# Patient Record
Sex: Female | Born: 1965 | Race: White | Hispanic: No | Marital: Single | Smoking: Never smoker
Health system: Southern US, Community
[De-identification: ages and names within clinical notes are randomized; demographics above are authoritative.]

## PROBLEM LIST (undated history)

## (undated) DIAGNOSIS — I10 Essential (primary) hypertension: Secondary | ICD-10-CM

## (undated) DIAGNOSIS — F32A Depression, unspecified: Secondary | ICD-10-CM

## (undated) DIAGNOSIS — G4730 Sleep apnea, unspecified: Secondary | ICD-10-CM

## (undated) DIAGNOSIS — K219 Gastro-esophageal reflux disease without esophagitis: Secondary | ICD-10-CM

## (undated) DIAGNOSIS — F329 Major depressive disorder, single episode, unspecified: Secondary | ICD-10-CM

## (undated) HISTORY — DX: Gastro-esophageal reflux disease without esophagitis: K21.9

## (undated) HISTORY — PX: CHOLECYSTECTOMY: SHX55

## (undated) HISTORY — DX: Essential (primary) hypertension: I10

## (undated) HISTORY — DX: Depression, unspecified: F32.A

## (undated) HISTORY — DX: Major depressive disorder, single episode, unspecified: F32.9

---

## 2015-11-04 ENCOUNTER — Emergency Department (HOSPITAL_COMMUNITY): Payer: Commercial Managed Care - HMO

## 2015-11-04 ENCOUNTER — Emergency Department (HOSPITAL_COMMUNITY)
Admission: EM | Admit: 2015-11-04 | Discharge: 2015-11-05 | Disposition: A | Discharge status: Home / Self Care | Payer: Commercial Managed Care - HMO | Attending: Emergency Medicine | Admitting: Emergency Medicine

## 2015-11-04 ENCOUNTER — Encounter (HOSPITAL_COMMUNITY): Payer: Self-pay | Admitting: *Deleted

## 2015-11-04 DIAGNOSIS — Z792 Long term (current) use of antibiotics: Secondary | ICD-10-CM | POA: Insufficient documentation

## 2015-11-04 DIAGNOSIS — Z79899 Other long term (current) drug therapy: Secondary | ICD-10-CM | POA: Diagnosis not present

## 2015-11-04 DIAGNOSIS — R079 Chest pain, unspecified: Secondary | ICD-10-CM | POA: Diagnosis present

## 2015-11-04 DIAGNOSIS — R0789 Other chest pain: Secondary | ICD-10-CM | POA: Insufficient documentation

## 2015-11-04 DIAGNOSIS — Z8669 Personal history of other diseases of the nervous system and sense organs: Secondary | ICD-10-CM | POA: Diagnosis not present

## 2015-11-04 DIAGNOSIS — Z8709 Personal history of other diseases of the respiratory system: Secondary | ICD-10-CM | POA: Diagnosis not present

## 2015-11-04 HISTORY — DX: Sleep apnea, unspecified: G47.30

## 2015-11-04 LAB — BASIC METABOLIC PANEL
Anion gap: 13 (ref 5–15)
BUN: 12 mg/dL (ref 6–20)
CO2: 23 mmol/L (ref 22–32)
Calcium: 10 mg/dL (ref 8.9–10.3)
Chloride: 104 mmol/L (ref 101–111)
Creatinine, Ser: 0.73 mg/dL (ref 0.44–1.00)
GFR calc Af Amer: >60 mL/min (ref >60)
GFR calc non Af Amer: >60 mL/min (ref >60)
GLUCOSE: 84 mg/dL (ref 65–99)
POTASSIUM: 4.1 mmol/L (ref 3.5–5.1)
SODIUM: 140 mmol/L (ref 135–145)

## 2015-11-04 LAB — CBC
HEMATOCRIT: 45.4 % (ref 36.0–46.0)
HEMOGLOBIN: 15.1 g/dL — AB (ref 12.0–15.0)
MCH: 28.4 pg (ref 26.0–34.0)
MCHC: 33.3 g/dL (ref 30.0–36.0)
MCV: 85.5 fL (ref 78.0–100.0)
Platelets: 235 10*3/uL (ref 150–400)
RBC: 5.31 MIL/uL — ABNORMAL HIGH (ref 3.87–5.11)
RDW: 12.9 % (ref 11.5–15.5)
WBC: 10.2 10*3/uL (ref 4.0–10.5)

## 2015-11-04 LAB — I-STAT TROPONIN, ED: Troponin i, poc: 0 ng/mL (ref 0.00–0.08)

## 2015-11-04 NOTE — ED Notes (Signed)
Patient presents stating that she started having chest tightness short time after taking her Zithromax that was prescribed for her bronchitis.  Denies SOB, no swelling or hives, no itching

## 2015-11-04 NOTE — ED Provider Notes (Signed)
CSN: [id number]     Arrival date & time 11/04/15  1942 History   By signing my name below, I, Arlan Organ, attest that this documentation has been prepared under the direction and in the presence of Blane Ohara, MD.  Electronically Signed: Arlan Organ, ED Scribe. 11/04/2015. 11:30 PM.   Chief Complaint  Patient presents with  . Chest Tightness    The history is provided by the patient. No language interpreter was used.    HPI Comments: Laura Rowe is a 49 y.o. female without any pertinent past medical history who presents to the Emergency Department complaining of constant, ongoing chest discomfort onset 1:00 PM this afternoon. She described feeling as "tightness". No aggravating or alleviating factors at this time. Pt states symptoms came on after taking a second dose of Zithromax prescribed for bronchitis. No recent fever, chills, nausea, vomiting, or shortness of breath. She denies any recent surgeries. No recent long distance travel. Pt denies any history of blood clots. No new foods in the last several days. Ms. Jennelle Human denies any history of syncopal episodes.  PCP: No PCP Per Patient    Past Medical History  Diagnosis Date  . Sleep apnea    Past Surgical History  Procedure Laterality Date  . Cholecystectomy     No family history on file. Social History  Substance Use Topics  . Smoking status: Never Smoker   . Smokeless tobacco: Never Used  . Alcohol Use: Yes     Comment: occasionally   OB History    No data available     Review of Systems  Constitutional: Negative for fever and chills.  Respiratory: Negative for shortness of breath.   Cardiovascular: Positive for chest pain. Negative for leg swelling.  Gastrointestinal: Negative for nausea, vomiting and abdominal pain.  Neurological: Negative for headaches.  Psychiatric/Behavioral: Negative for confusion.  All other systems reviewed and are negative.     Allergies  Gluten meal  Home Medications   Prior  to Admission medications   Medication Sig Start Date End Date Taking? Authorizing Provider  azithromycin (ZITHROMAX) 250 MG tablet Take 250 mg by mouth daily. Take for 4 days 11/03/15 11/07/15 Yes Historical Provider, MD  chlorpheniramine-HYDROcodone (TUSSIONEX) 10-8 MG/5ML SUER Take 5 mLs by mouth 2 (two) times daily.   Yes Historical Provider, MD  guaiFENesin-dextromethorphan (ROBITUSSIN DM) 100-10 MG/5ML syrup Take 10 mLs by mouth every 6 (six) hours as needed for cough.   Yes Historical Provider, MD   Triage Vitals: BP 149/78 mmHg  Pulse 69  Temp(Src) 97.7 F (36.5 C) (Oral)  Resp 18  Ht  (1.6 m)  Wt 280 lb (127.007 kg)  BMI 49.61 kg/m2  SpO2 99%  LMP 10/04/2015   Physical Exam  Constitutional: She is oriented to person, place, and time. She appears well-developed and well-nourished. No distress.  HENT:  Head: Normocephalic and atraumatic.  No angioedema on exam  Eyes: EOM are normal.  Neck: Normal range of motion.  Cardiovascular: Normal rate, regular rhythm and normal heart sounds.   No murmur heard. Pulmonary/Chest: Effort normal and breath sounds normal. No stridor. No respiratory distress. She has no wheezes.  Abdominal: Soft. She exhibits no distension. There is no tenderness.  Musculoskeletal: Normal range of motion. She exhibits no edema.  No leg swelling on exam  Neurological: She is alert and oriented to person, place, and time.  Skin: Skin is warm and dry.  Psychiatric: She has a normal mood and affect. Judgment normal.  Nursing note  and vitals reviewed.   ED Course  Procedures (including critical care time)  DIAGNOSTIC STUDIES: Oxygen Saturation is 99% on RA, Normal by my interpretation.    COORDINATION OF CARE: 11:28 PM- Will order CXR, BMP, CBC, i-stat troponin, and EKG. Discussed treatment plan with pt at bedside and pt agreed to plan.     Labs Review Labs Reviewed  CBC - Abnormal; Notable for the following:    RBC 5.31 (*)    Hemoglobin 15.1 (*)     All other components within normal limits  BASIC METABOLIC PANEL  I-STAT TROPOININ, ED  Rosezena SensorI-STAT TROPOININ, ED    Imaging Review Dg Chest 2 View  11/04/2015  CLINICAL DATA:  49 year old female with chest tightness for the past 7 hours. Recently diagnosis with acute bronchitis. EXAM: CHEST  2 VIEW COMPARISON:  No priors. FINDINGS: Lung volumes are normal. No consolidative airspace disease. No pleural effusions. No pneumothorax. No pulmonary nodule or mass noted. Pulmonary vasculature and the cardiomediastinal silhouette are within normal limits. IMPRESSION: No radiographic evidence of acute cardiopulmonary disease. Electronically Signed   By: Trudie Reedaniel  Entrikin M.D.   On: 11/04/2015 20:28   I have personally reviewed and evaluated these images and lab results as part of my medical decision-making.   EKG Interpretation   Date/Time:  Saturday November 04 2015 19:48:06 EST Ventricular Rate:  82 PR Interval:  186 QRS Duration: 96 QT Interval:  398 QTC Calculation: 464 R Axis:   -45 Text Interpretation:  Normal sinus rhythm Possible Left atrial enlargement  Low voltage QRS Incomplete right bundle branch block Left anterior  fascicular block Abnormal ECG Confirmed by Kellina Dreese  MD, Mykala Mccready ([id number]) on  11/04/2015 11:06:49 PM      MDM   Final diagnoses:  Chest tightness   Patient presents with mild chest tightness that started after taking azithromycin for the second time. No angioedema. Patient improved in the ER. Patient low risk cardiac troponins negative. Patient comfortable close outpatient follow-up with primary doctor. Patient denies blood clot risk factors. Vital signs unremarkable.  Results and differential diagnosis were discussed with the patient/parent/guardian. Xrays were independently reviewed by myself.  Close follow up outpatient was discussed, comfortable with the plan.   Medications - No data to display  Filed Vitals:   11/04/15 2315 11/04/15 2330 11/04/15 2345  11/05/15 0027  BP: 136/79 148/107 140/76 149/78  Pulse: 59 68 67 69  Temp:    97.7 F (36.5 C)  TempSrc:    Oral  Resp: 14 14 14 18   Height:      Weight:      SpO2: 99% 99% 98% 99%    Final diagnoses:  Chest tightness      Blane OharaJoshua Farzad Tibbetts, MD 11/05/15 [phone]

## 2015-11-05 LAB — I-STAT TROPONIN, ED: TROPONIN I, POC: 0 ng/mL (ref 0.00–0.08)

## 2015-11-05 NOTE — Discharge Instructions (Signed)
If you were given medicines take as directed.  If you are on coumadin or contraceptives realize their levels and effectiveness is altered by many different medicines.  If you have any reaction (rash, tongues swelling, other) to the medicines stop taking and see a physician.   Discontinue azithromycin If your blood pressure was elevated in the ER make sure you follow up for management with a primary doctor or return for chest pain, shortness of breath or stroke symptoms.  Please follow up as directed and return to the ER or see a physician for new or worsening symptoms.  Thank you. Filed Vitals:   11/04/15 2300 11/04/15 2315 11/04/15 2330 11/04/15 2345  BP: 145/79 136/79 148/107 140/76  Pulse: 69 59 68 67  Temp:      TempSrc:      Resp: 15 14 14 14   Height:      Weight:      SpO2: 100% 99% 99% 98%

## 2015-11-07 ENCOUNTER — Ambulatory Visit (INDEPENDENT_AMBULATORY_CARE_PROVIDER_SITE_OTHER): Payer: 59 | Admitting: Family Medicine

## 2015-11-07 ENCOUNTER — Encounter: Payer: Self-pay | Admitting: Family Medicine

## 2015-11-07 VITALS — BP 136/90 | Ht 63.0 in | Wt 277.0 lb

## 2015-11-07 DIAGNOSIS — M722 Plantar fascial fibromatosis: Secondary | ICD-10-CM | POA: Diagnosis not present

## 2015-11-13 DIAGNOSIS — M722 Plantar fascial fibromatosis: Secondary | ICD-10-CM | POA: Insufficient documentation

## 2015-11-13 NOTE — Progress Notes (Signed)
  Laura Rowe - 50 y.o. female MRN [id number]  Date of birth: [birth year]  CC: B/l foot pain  SUBJECTIVE:   HPI  B/l foot pain - x  18 months, worsening - Primarily in arch - Has cork insoles which helped some - R>L  - No fevers, chills, or night sweats - NO history of foot issues.  - Pain worst in the AM with 1st steps and walking on hardwood floors.  - No icing, stretching - Occasional NSAIDS with minimal relief.   ROS:     10 point RoS negtaive other than that listed above in HPI.   HISTORY: Past Medical, Surgical, Social, and Family History Reviewed & Updated per EMR.    OBJECTIVE: BP 136/90 mmHg  Ht 5\' 3"  (1.6 m)  Wt 277 lb (125.646 kg)  BMI 49.08 kg/m2  Physical Exam  Calm, NAD Non-labored breathing  Normal inspection with no visable or palpable fat pad atrophy and no visible swelling/erythema. Patient is tender at medial insertion of plantar fascia into calcaneus. Great toe motion: wnl Pes planus Other foot breakdown:none   MEDICATIONS, LABS & OTHER ORDERS: Previous Medications   No medications on file   Modified Medications   No medications on file   New Prescriptions   No medications on file   Discontinued Medications   No medications on file   Orders Placed This Encounter  Procedures  . Ambulatory referral to Physical Therapy   ASSESSMENT & PLAN: See problem based charting & AVS for pt instructions.

## 2015-11-13 NOTE — Assessment & Plan Note (Addendum)
Discussed diagnosis of plantar fasciitis.  Discussed weight loss.  No avoidance of exercise necessary. Streching/ strengthening/icing. Provided with green insole with scaphoid pain. Preferred PT referral considering chronicity. F/u 6-8 weeks.

## 2015-11-20 ENCOUNTER — Ambulatory Visit: Payer: 59

## 2015-12-12 ENCOUNTER — Ambulatory Visit: Payer: 59 | Admitting: Family Medicine

## 2016-04-23 ENCOUNTER — Encounter: Payer: Self-pay | Admitting: Family Medicine

## 2016-05-18 ENCOUNTER — Ambulatory Visit (INDEPENDENT_AMBULATORY_CARE_PROVIDER_SITE_OTHER): Payer: Commercial Managed Care - HMO | Admitting: Family Medicine

## 2016-05-18 VITALS — BP 124/76 | HR 80 | Temp 98.2°F | Resp 12 | Ht 63.0 in | Wt 285.0 lb

## 2016-05-18 DIAGNOSIS — L237 Allergic contact dermatitis due to plants, except food: Secondary | ICD-10-CM | POA: Diagnosis not present

## 2016-05-18 MED ORDER — PREDNISONE 5 MG (48) PO TBPK: ORAL_TABLET | ORAL | Status: DC

## 2016-05-18 MED ORDER — TRIAMCINOLONE ACETONIDE 0.1 % EX CREA: 1.0000 "application " | TOPICAL_CREAM | Freq: Two times a day (BID) | CUTANEOUS | Status: DC

## 2016-05-18 NOTE — Progress Notes (Signed)
    Laura Rowe is a 50 y.o. female who presents to [facility] today for rash. Patient notes a pruritic/painful rash on her face and both upper and lower extremities present for the last week. She thinks her dog got into some poison ivy. She denies any new medications or new soap or detergent shampoos cosmetics etc. She feels well with no fevers chills nausea vomiting or diarrhea. She's used some Benadryl cream over-the-counter which has helped only a tiny little bit. Her current symptoms are consistent with previous episodes of poison ivy dermatitis.   Past Medical History  Diagnosis Date  . Sleep apnea    Past Surgical History  Procedure Laterality Date  . Cholecystectomy     Social History  Substance Use Topics  . Smoking status: Never Smoker   . Smokeless tobacco: Not on file  . Alcohol Use: 0.0 oz/week    0 Standard drinks or equivalent per week     Comment: occasionally   ROS as above No headache, visual changes, nausea, vomiting, diarrhea, constipation, dizziness, abdominal pain, fevers, chills, night sweats, weight loss, swollen lymph nodes, body aches, joint swelling, muscle aches, chest pain, shortness of breath, mood changes, visual or auditory hallucinations.   Medications: Current Outpatient Prescriptions  Medication Sig Dispense Refill  . chlorpheniramine-HYDROcodone (TUSSIONEX) 10-8 MG/5ML SUER Take 5 mLs by mouth 2 (two) times daily.    Marland Kitchen. guaiFENesin-dextromethorphan (ROBITUSSIN DM) 100-10 MG/5ML syrup Take 10 mLs by mouth every 6 (six) hours as needed for cough. Reported on 05/18/2016    . predniSONE (STERAPRED UNI-PAK 48 TAB) 5 MG (48) TBPK tablet 12 day dosepack po 48 tablet 0  . triamcinolone cream (KENALOG) 0.1 % Apply 1 application topically 2 (two) times daily. 453.6 g 0   No current facility-administered medications for this visit.   Allergies  Allergen Reactions  . Gluten Meal      Exam:  BP 124/76 mmHg  Pulse 80  Temp(Src) 98.2 F (36.8 C) (Oral)   Resp 12  Ht 5\' 3"  (1.6 m)  Wt 285 lb (129.275 kg)  BMI 50.50 kg/m2  SpO2 96%  LMP 05/03/2016 Gen: Well NAD HEENT: EOMI,  MMM Lungs: Normal work of breathing. CTABL Heart: RRR no MRG Abd: NABS, Soft. Nondistended, Nontender Exts: Brisk capillary refill, warm and well perfused.  Skin: Maculopapular erythematous rash with some small vesicles. Nontender. Rash occurs on neck and both arms and both legs.  No results found for this or any previous visit (from the past 24 hour(s)). No results found.  Assessment and Plan: 50 y.o. female with poison ivy dermatitis. Plan to treat with triamcinolone cream, oral prednisone as needed, Benadryl by mouth, and topical Gold Bond Itch as needed. Return as needed or if worsening.  Discussed warning signs or symptoms. Please see discharge instructions. Patient expresses understanding.

## 2016-05-18 NOTE — Patient Instructions (Addendum)
Thank you for coming in today. Apply triamcinolone. Take prednisone if worsening or not better. Use over-the-counter Gold Bond Itch as needed for temporary control of itching. Use over-the-counter oral Benadryl as needed for itching as well especially at nighttime. Return if not improved or if worsening. Call or go to the emergency room if you get worse, have trouble breathing, have chest pains, or palpitations.   Poison [employer] Poison ivy is a inflammation of the skin (contact dermatitis) caused by touching the allergens on the leaves of the ivy plant following previous exposure to the plant. The rash usually appears 48 hours after exposure. The rash is usually bumps (papules) or blisters (vesicles) in a linear pattern. Depending on your own sensitivity, the rash may simply cause redness and itching, or it may also progress to blisters which may break open. These must be well cared for to prevent secondary bacterial (germ) infection, followed by scarring. Keep any open areas dry, clean, dressed, and covered with an antibacterial ointment if needed. The eyes may also get puffy. The puffiness is worst in the morning and gets better as the day progresses. This dermatitis usually heals without scarring, within 2 to 3 weeks without treatment. HOME CARE INSTRUCTIONS  Thoroughly wash with soap and water as soon as you have been exposed to poison ivy. You have about one half hour to remove the plant resin before it will cause the rash. This washing will destroy the oil or antigen on the skin that is causing, or will cause, the rash. Be sure to wash under your fingernails as any plant resin there will continue to spread the rash. Do not rub skin vigorously when washing affected area. Poison ivy cannot spread if no oil from the plant remains on your body. A rash that has progressed to weeping sores will not spread the rash unless you have not washed thoroughly. It is also important to wash any clothes you have been  wearing as these may carry active allergens. The rash will return if you wear the unwashed clothing, even several days later. Avoidance of the plant in the future is the best measure. Poison ivy plant can be recognized by the number of leaves. Generally, poison ivy has three leaves with flowering branches on a single stem. Diphenhydramine may be purchased over the counter and used as needed for itching. Do not drive with this medication if it makes you drowsy.Ask your caregiver about medication for children. SEEK MEDICAL CARE IF:  Open sores develop.  Redness spreads beyond area of rash.  You notice purulent (pus-like) discharge.  You have increased pain.  Other signs of infection develop (such as fever).   This information is not intended to replace advice given to you by your health care provider. Make sure you discuss any questions you have with your health care provider.   Document Released: 10/18/2000 Document Revised: 01/13/2012 Document Reviewed: 03/29/2015 Elsevier Interactive Patient Education 2016 [employer]    IF you received an x-ray today, you will receive an invoice from [facility]. Please contact [facility] at [phone] with questions or concerns regarding your invoice.   IF you received labwork today, you will receive an invoice from [facility]. Please contact [facility] at [phone] with questions or concerns regarding your invoice.   Our billing staff will not be able to assist you with questions regarding bills from these companies.  You will be contacted with the lab results as soon as they are available. The fastest way  to get your results is to activate your My Chart account. Instructions are located on the last page of this paperwork. If you have not heard from [place] regarding the results in 2 weeks, please contact this office.

## 2016-07-16 ENCOUNTER — Encounter (HOSPITAL_COMMUNITY): Payer: Self-pay | Admitting: Emergency Medicine

## 2016-07-16 ENCOUNTER — Emergency Department (HOSPITAL_COMMUNITY): Payer: 59

## 2016-07-16 ENCOUNTER — Emergency Department (HOSPITAL_COMMUNITY)
Admission: EM | Admit: 2016-07-16 | Discharge: 2016-07-17 | Disposition: A | Discharge status: Home / Self Care | Payer: 59 | Attending: Emergency Medicine | Admitting: Emergency Medicine

## 2016-07-16 DIAGNOSIS — I10 Essential (primary) hypertension: Secondary | ICD-10-CM | POA: Insufficient documentation

## 2016-07-16 DIAGNOSIS — R1032 Left lower quadrant pain: Secondary | ICD-10-CM | POA: Diagnosis present

## 2016-07-16 DIAGNOSIS — K5732 Diverticulitis of large intestine without perforation or abscess without bleeding: Secondary | ICD-10-CM | POA: Insufficient documentation

## 2016-07-16 DIAGNOSIS — K5792 Diverticulitis of intestine, part unspecified, without perforation or abscess without bleeding: Secondary | ICD-10-CM

## 2016-07-16 LAB — URINALYSIS, ROUTINE W REFLEX MICROSCOPIC
Bilirubin Urine: NEGATIVE
GLUCOSE, UA: NEGATIVE mg/dL
Hgb urine dipstick: NEGATIVE
Ketones, ur: 15 mg/dL — AB
LEUKOCYTES UA: NEGATIVE
NITRITE: NEGATIVE
PROTEIN: NEGATIVE mg/dL
Specific Gravity, Urine: 1.009 (ref 1.005–1.030)
pH: 5.5 (ref 5.0–8.0)

## 2016-07-16 LAB — COMPREHENSIVE METABOLIC PANEL
ALT: 19 U/L (ref 14–54)
AST: 18 U/L (ref 15–41)
Albumin: 4.1 g/dL (ref 3.5–5.0)
Alkaline Phosphatase: 54 U/L (ref 38–126)
Anion gap: 8 (ref 5–15)
BUN: 9 mg/dL (ref 6–20)
CHLORIDE: 106 mmol/L (ref 101–111)
CO2: 24 mmol/L (ref 22–32)
CREATININE: 0.61 mg/dL (ref 0.44–1.00)
Calcium: 8.9 mg/dL (ref 8.9–10.3)
Glucose, Bld: 114 mg/dL — ABNORMAL HIGH (ref 65–99)
Potassium: 3.2 mmol/L — ABNORMAL LOW (ref 3.5–5.1)
Sodium: 138 mmol/L (ref 135–145)
TOTAL PROTEIN: 7.3 g/dL (ref 6.5–8.1)
Total Bilirubin: 2.6 mg/dL — ABNORMAL HIGH (ref 0.3–1.2)

## 2016-07-16 LAB — CBC
HCT: 41.9 % (ref 36.0–46.0)
Hemoglobin: 14.6 g/dL (ref 12.0–15.0)
MCH: 29.3 pg (ref 26.0–34.0)
MCHC: 34.8 g/dL (ref 30.0–36.0)
MCV: 84 fL (ref 78.0–100.0)
PLATELETS: 311 10*3/uL (ref 150–400)
RBC: 4.99 MIL/uL (ref 3.87–5.11)
RDW: 13.4 % (ref 11.5–15.5)
WBC: 13.3 10*3/uL — AB (ref 4.0–10.5)

## 2016-07-16 LAB — I-STAT BETA HCG BLOOD, ED (MC, WL, AP ONLY)

## 2016-07-16 LAB — LIPASE, BLOOD: LIPASE: 23 U/L (ref 11–51)

## 2016-07-16 MED ORDER — IOPAMIDOL (ISOVUE-300) INJECTION 61%: 30.0000 mL | Freq: Once | INTRAVENOUS | Status: DC | PRN

## 2016-07-16 MED ORDER — ONDANSETRON HCL 4 MG/2ML IJ SOLN
4.0000 mg | Freq: Once | INTRAMUSCULAR | Status: AC
  Administered 2016-07-16: 4 mg via INTRAVENOUS
  Filled 2016-07-16: qty 2

## 2016-07-16 MED ORDER — SODIUM CHLORIDE 0.9 % IV BOLUS (SEPSIS)
1000.0000 mL | Freq: Once | INTRAVENOUS | Status: AC
  Administered 2016-07-16: 1000 mL via INTRAVENOUS

## 2016-07-16 MED ORDER — MORPHINE SULFATE (PF) 4 MG/ML IV SOLN
4.0000 mg | Freq: Once | INTRAVENOUS | Status: AC
  Administered 2016-07-16: 4 mg via INTRAVENOUS
  Filled 2016-07-16: qty 1

## 2016-07-16 MED ORDER — IOPAMIDOL (ISOVUE-300) INJECTION 61%
100.0000 mL | Freq: Once | INTRAVENOUS | Status: AC | PRN
  Administered 2016-07-16: 100 mL via INTRAVENOUS

## 2016-07-16 MED ORDER — IOPAMIDOL (ISOVUE-300) INJECTION 61%: 15.0000 mL | Freq: Once | INTRAVENOUS | Status: DC | PRN

## 2016-07-16 NOTE — ED Triage Notes (Signed)
Pt states she has been having abd pain for the past 30hrs   Pt states she went to urgent care and was sent here to have a CT to rule out diverticulitis  Pt states she has been having sharp spasm like pain in her left side and was tender upon palpation at the dr office  Pt states they did a UA on her there and it was negative

## 2016-07-16 NOTE — ED Provider Notes (Signed)
[facility]-EMERGENCY DEPT Provider Note   CSN: [id number] Arrival date & time: 07/16/16  2019  By signing my name below, I, Linna DarnerRussell Turner, attest that this documentation has been prepared under the direction and in the presence of Will Sameul Tagle, PA-C. Electronically Signed: Linna Darnerussell Turner, Scribe. 07/16/2016. 10:28 PM.  History   Chief Complaint Chief Complaint  Patient presents with  . Abdominal Pain    The history is provided by the patient. No language interpreter was used.     HPI Comments: Laura Rowe is a 50 y.o. female who presents to the Emergency Department complaining of gradual onset, constant, LLQ abdominal pain for the last 34 hours. Pt notes associated spasms which she describes as a fluctuation in intensity of her abdominal pain. She rates her current abdominal pain at a 3 but states it becomes a 6 with the spasms. She states she was experiencing abdominal spasms every 30 minutes last night. Pt reports she has stopped eating over the last several hours which has improved her abdominal pain. Pt endorses associated nausea, but no vomiting.  Pt reports she had a small bowel movement today and that is the only one since onset. Pt reports a h/o cholecystectomy. She denies experiencing similar abdominal pain in the past. Pt reports she is due for her next menstrual cycle within the next few days and usually has normal menstrual cycles.  She denies h/o diverticulitis or h/o kidney stones. Pt further denies vomiting, diarrhea, hematochezia, melena, dysuria, difficulty urinating, frequency, urgency, hematuria, vaginal bleeding, vaginal discharge, cough, CP, SOB, fever, trouble swallowing, new back pain (has back pain at baseline), or any other associated symptoms.  Past Medical History:  Diagnosis Date  . Sleep apnea     Patient Active Problem List   Diagnosis Date Noted  . Plantar fasciitis, bilateral 11/13/2015    Past Surgical History:  Procedure Laterality Date  . CHOLECYSTECTOMY       OB History    Gravida Para Term Preterm AB Living   0 0 0 0 0     SAB TAB Ectopic Multiple Live Births   0 0 0           Home Medications    Prior to Admission medications   Medication Sig Start Date End Date Taking? Authorizing Provider  acetaminophen (TYLENOL) 325 MG tablet Take 2 tablets (650 mg total) by mouth every 6 (six) hours as needed. 07/17/16   Everlene FarrierWilliam Batul Diego, PA-C  ciprofloxacin (CIPRO) 500 MG tablet Take 1 tablet (500 mg total) by mouth 2 (two) times daily. 07/17/16   Everlene FarrierWilliam Raul Torrance, PA-C  metroNIDAZOLE (FLAGYL) 500 MG tablet Take 1 tablet (500 mg total) by mouth 3 (three) times daily. 07/17/16   Everlene FarrierWilliam Ehsan Corvin, PA-C  ondansetron (ZOFRAN ODT) 4 MG disintegrating tablet Take 1 tablet (4 mg total) by mouth every 8 (eight) hours as needed for nausea or vomiting. 07/17/16   Everlene FarrierWilliam Avina Eberle, PA-C  predniSONE (STERAPRED UNI-PAK 48 TAB) 5 MG (48) TBPK tablet 12 day dosepack po Patient not taking: Reported on 07/16/2016 05/18/16   Rodolph BongEvan S Corey, MD  triamcinolone cream (KENALOG) 0.1 % Apply 1 application topically 2 (two) times daily. Patient not taking: Reported on 07/16/2016 05/18/16   Rodolph BongEvan S Corey, MD    Family History Family History  Problem Relation Age of Onset  . Family history unknown: Yes    Social History Social History  Substance Use Topics  . Smoking status: Never Smoker  . Smokeless tobacco: Never Used  . Alcohol use  0.0 oz/week     Comment: occasionally     Allergies   Gluten meal   Review of Systems Review of Systems  Constitutional: Negative for chills and fever.  HENT: Negative for congestion, sore throat and trouble swallowing.   Eyes: Negative for visual disturbance.  Respiratory: Negative for cough and shortness of breath.   Cardiovascular: Negative for chest pain.  Gastrointestinal: Positive for abdominal pain (and spasms) and nausea. Negative for abdominal distention, blood in stool, diarrhea and vomiting.       Negative for melena.    Genitourinary: Negative for difficulty urinating, dysuria, frequency, hematuria, urgency, vaginal bleeding and vaginal discharge.  Musculoskeletal: Positive for back pain (at baseline). Negative for neck pain.  Skin: Negative for rash.  Neurological: Negative for headaches.     Physical Exam Updated Vital Signs BP 143/69   Pulse 86   Temp 97.9 F (36.6 C) (Oral)   Resp 18   Ht 5\' 3"  (1.6 m)   Wt 128.4 kg   LMP 06/09/2016 (Approximate) Comment: HCG neg 07/16/16  SpO2 98%   BMI 50.13 kg/m   Physical Exam  Constitutional: She appears well-developed and well-nourished. No distress.  Non-toxic appearing.  HENT:  Head: Normocephalic and atraumatic.  Mouth/Throat: Oropharynx is clear and moist.  Eyes: Conjunctivae are normal. Pupils are equal, round, and reactive to light. Right eye exhibits no discharge. Left eye exhibits no discharge.  Neck: Neck supple.  Cardiovascular: Normal rate, regular rhythm, normal heart sounds and intact distal pulses.  Exam reveals no gallop and no friction rub.   No murmur heard. Pulmonary/Chest: Effort normal and breath sounds normal. No respiratory distress. She has no wheezes. She has no rales.  Abdominal: Soft. Bowel sounds are normal. She exhibits no distension and no mass. There is tenderness. There is no rebound and no guarding.  Abdomen is soft. Bowel sounds are present. Patient has left lower quadrant tenderness to palpation. No CVA or flank tenderness. No psoas sign.  Musculoskeletal: She exhibits no edema.  Lymphadenopathy:    She has no cervical adenopathy.  Neurological: She is alert. Coordination normal.  Skin: Skin is warm and dry. No rash noted. She is not diaphoretic. No erythema. No pallor.  Psychiatric: She has a normal mood and affect. Her behavior is normal.  Nursing note and vitals reviewed.   ED Treatments / Results  Labs (all labs ordered are listed, but only abnormal results are displayed) Labs Reviewed  COMPREHENSIVE  METABOLIC PANEL - Abnormal; Notable for the following:       Result Value   Potassium 3.2 (*)    Glucose, Bld 114 (*)    Total Bilirubin 2.6 (*)    All other components within normal limits  CBC - Abnormal; Notable for the following:    WBC 13.3 (*)    All other components within normal limits  URINALYSIS, ROUTINE W REFLEX MICROSCOPIC (NOT AT [facility]) - Abnormal; Notable for the following:    Ketones, ur 15 (*)    All other components within normal limits  LIPASE, BLOOD  I-STAT BETA HCG BLOOD, ED ([facility], [facility], [facility] ONLY)    EKG  EKG Interpretation None       Radiology Ct Abdomen Pelvis W Contrast  Result Date: 07/17/2016 CLINICAL DATA:  Abdominal pain 430 hours.  Predominantly left-sided. EXAM: CT ABDOMEN AND PELVIS WITH CONTRAST TECHNIQUE: Multidetector CT imaging of the abdomen and pelvis was performed using the standard protocol following bolus administration of intravenous contrast. CONTRAST:  ISOVUE-300  IOPAMIDOL (ISOVUE-300) INJECTION 61% COMPARISON:  None. FINDINGS: Lower chest: No significant abnormality Hepatobiliary: Cholecystectomy. No focal liver lesion. Normal bile ducts. Pancreas: Normal Spleen: Normal Adrenals/Urinary Tract: The adrenals and kidneys are normal in appearance. There is no urinary calculus evident. There is no hydronephrosis or ureteral dilatation. Collecting systems and ureters appear unremarkable. Urinary bladder is unremarkable. Stomach/Bowel: There are acute inflammatory changes surrounding a diverticulum of the proximal sigmoid colon with appearances typical of acute diverticulitis. No abscess. Moderate left hemicolon diverticulosis. Stomach, small bowel and right hemicolon are unremarkable. Vascular/Lymphatic: The abdominal aorta is normal in caliber. There is no atherosclerotic calcification. There is no adenopathy in the abdomen or pelvis. Reproductive: Uterus and ovaries are unremarkable Other: No ascites Musculoskeletal: No significant skeletal lesion  IMPRESSION: Acute diverticulitis, proximal sigmoid.  No abscess. Electronically Signed   By: Ellery Plunk M.D.   On: 07/17/2016 00:06    Procedures Procedures (including critical care time)  DIAGNOSTIC STUDIES: Oxygen Saturation is 100% on RA, normal by my interpretation.    COORDINATION OF CARE: 10:28 PM Discussed treatment plan with pt at bedside and pt agreed to plan.  Medications Ordered in ED Medications  iopamidol (ISOVUE-300) 61 % injection 15 mL (not administered)  iopamidol (ISOVUE-300) 61 % injection 30 mL (not administered)  iopamidol (ISOVUE-300) 61 % injection 30 mL (not administered)  iopamidol (ISOVUE-300) 61 % injection 30 mL (not administered)  potassium chloride SA (K-DUR,KLOR-CON) CR tablet 20 mEq (not administered)  sodium chloride 0.9 % bolus 1,000 mL (0 mLs Intravenous Stopped 07/17/16 0233)  ondansetron (ZOFRAN) injection 4 mg (4 mg Intravenous Given 07/16/16 2330)  morphine 4 MG/ML injection 4 mg (4 mg Intravenous Given 07/16/16 2330)  iopamidol (ISOVUE-300) 61 % injection 100 mL (100 mLs Intravenous Contrast Given 07/16/16 2344)     Initial Impression / Assessment and Plan / ED Course  I have reviewed the triage vital signs and the nursing notes.  Pertinent labs & imaging results that were available during my care of the patient were reviewed by me and considered in my medical decision making (see chart for details).  Clinical Course   This  is a 50 y.o. female who presents to the Emergency Department complaining of gradual onset, constant, LLQ abdominal pain for the last 34 hours. Pt notes associated spasms which she describes as a fluctuation in intensity of her abdominal pain. She rates her current abdominal pain at a 3 but states it becomes a 6 with the spasms. She states she was experiencing abdominal spasms every 30 minutes last night. Pt reports she has stopped eating over the last several hours which has improved her abdominal pain. Pt endorses  associated nausea, but no vomiting.  Pt reports she had a small bowel movement today and that is the only one since onset. Pt reports a h/o cholecystectomy. She denies experiencing similar abdominal pain in the past. Pt reports she is due for her next menstrual cycle within the next few days and usually has normal menstrual cycles.  She denies h/o diverticulitis or h/o kidney stones. Pt further denies vomiting, diarrhea, hematochezia, melena, fevers, or urinary symptoms.  On exam the patient is afebrile and nontoxic appearing. Her abdomen is soft and she has left lower quadrant abdominal tenderness to palpation. No peritoneal signs. Pregnancy test is negative. Lipase is within normal limits. CMP is remarkable for a potassium of 3.2 and a total bilirubin of 2.6. CBC is remarkable only for a white count of 13,300. Urinalysis shows no sign of  infection. CT abdomen and pelvis with contrast reveals acute diverticulitis in the proximal sigmoid without abscess. Had recheck patient reports she is feeling better. She is tolerating by mouth. We will discharge her with prescriptions for Cipro, Flagyl, Zofran and Tylenol. Patient declined narcotic pain medicine prescription. I discussed the expected course and treatment of diverticulitis. I discussed strict and specific return precautions. I advised the patient to follow-up with their primary care provider this week. I advised the patient to return to the emergency department with new or worsening symptoms or new concerns. The patient verbalized understanding and agreement with plan.    This patient was discussed with Dr. Freida Busman who agrees with assessment and plan.   I personally performed the services described in this documentation, which was scribed in my presence. The recorded information has been reviewed and is accurate.     Final Clinical Impressions(s) / ED Diagnoses   Final diagnoses:  Diverticulitis of intestine without perforation or abscess without  bleeding    New Prescriptions Discharge Medication List as of 07/17/2016  2:33 AM    START taking these medications   Details  ciprofloxacin (CIPRO) 500 MG tablet Take 1 tablet (500 mg total) by mouth 2 (two) times daily., Starting Wed 07/17/2016, Print    metroNIDAZOLE (FLAGYL) 500 MG tablet Take 1 tablet (500 mg total) by mouth 3 (three) times daily., Starting Wed 07/17/2016, Print    ondansetron (ZOFRAN ODT) 4 MG disintegrating tablet Take 1 tablet (4 mg total) by mouth every 8 (eight) hours as needed for nausea or vomiting., Starting Wed 07/17/2016, Print         Everlene Farrier, PA-C 07/17/16 1610    Lorre Nick, MD 07/20/16 [phone]

## 2016-07-16 NOTE — ED Notes (Signed)
ED PA Will Dansie made aware of beta hcg <5.0

## 2016-07-17 MED ORDER — ACETAMINOPHEN 325 MG PO TABS: 650.0000 mg | ORAL_TABLET | Freq: Four times a day (QID) | ORAL | 0 refills | Status: AC | PRN

## 2016-07-17 MED ORDER — METRONIDAZOLE 500 MG PO TABS: 500.0000 mg | ORAL_TABLET | Freq: Three times a day (TID) | ORAL | 0 refills | Status: DC

## 2016-07-17 MED ORDER — ONDANSETRON 4 MG PO TBDP: 4.0000 mg | ORAL_TABLET | Freq: Three times a day (TID) | ORAL | 0 refills | Status: AC | PRN

## 2016-07-17 MED ORDER — POTASSIUM CHLORIDE CRYS ER 20 MEQ PO TBCR
20.0000 meq | EXTENDED_RELEASE_TABLET | Freq: Once | ORAL | Status: DC
  Filled 2016-07-17: qty 1

## 2016-07-17 MED ORDER — CIPROFLOXACIN HCL 500 MG PO TABS: 500.0000 mg | ORAL_TABLET | Freq: Two times a day (BID) | ORAL | 0 refills | Status: DC

## 2016-07-17 NOTE — ED Notes (Signed)
See paper charting 

## 2016-07-18 ENCOUNTER — Encounter: Payer: Self-pay | Admitting: Gastroenterology

## 2016-07-19 ENCOUNTER — Ambulatory Visit (INDEPENDENT_AMBULATORY_CARE_PROVIDER_SITE_OTHER): Payer: 59 | Admitting: Family Medicine

## 2016-07-19 ENCOUNTER — Encounter: Payer: Self-pay | Admitting: Family Medicine

## 2016-07-19 VITALS — BP 130/80 | HR 69 | Resp 12 | Ht 63.0 in | Wt 281.4 lb

## 2016-07-19 DIAGNOSIS — R010 Benign and innocent cardiac murmurs: Secondary | ICD-10-CM

## 2016-07-19 DIAGNOSIS — K5792 Diverticulitis of intestine, part unspecified, without perforation or abscess without bleeding: Secondary | ICD-10-CM | POA: Diagnosis not present

## 2016-07-19 DIAGNOSIS — Z9989 Dependence on other enabling machines and devices: Secondary | ICD-10-CM

## 2016-07-19 DIAGNOSIS — E876 Hypokalemia: Secondary | ICD-10-CM

## 2016-07-19 DIAGNOSIS — G4733 Obstructive sleep apnea (adult) (pediatric): Secondary | ICD-10-CM

## 2016-07-19 DIAGNOSIS — R011 Cardiac murmur, unspecified: Secondary | ICD-10-CM

## 2016-07-19 DIAGNOSIS — K219 Gastro-esophageal reflux disease without esophagitis: Secondary | ICD-10-CM | POA: Insufficient documentation

## 2016-07-19 DIAGNOSIS — R197 Diarrhea, unspecified: Secondary | ICD-10-CM

## 2016-07-19 LAB — CBC WITH DIFFERENTIAL/PLATELET
BASOS PCT: 1 %
Basophils Absolute: 57 cells/uL (ref 0–200)
EOS PCT: 2 %
Eosinophils Absolute: 114 cells/uL (ref 15–500)
HCT: 41.9 % (ref 35.0–45.0)
Hemoglobin: 14 g/dL (ref 11.7–15.5)
LYMPHS PCT: 28 %
Lymphs Abs: 1596 cells/uL (ref 850–3900)
MCH: 28.4 pg (ref 27.0–33.0)
MCHC: 33.4 g/dL (ref 32.0–36.0)
MCV: 85 fL (ref 80.0–100.0)
MONO ABS: 570 {cells}/uL (ref 200–950)
MONOS PCT: 10 %
MPV: 9.4 fL (ref 7.5–12.5)
Neutro Abs: 3363 cells/uL (ref 1500–7800)
Neutrophils Relative %: 59 %
Platelets: 303 10*3/uL (ref 140–400)
RBC: 4.93 MIL/uL (ref 3.80–5.10)
RDW: 13.5 % (ref 11.0–15.0)
WBC: 5.7 10*3/uL (ref 3.8–10.8)

## 2016-07-19 LAB — BASIC METABOLIC PANEL
BUN: 6 mg/dL — AB (ref 7–25)
CO2: 25 mmol/L (ref 20–31)
Calcium: 8.7 mg/dL (ref 8.6–10.2)
Chloride: 105 mmol/L (ref 98–110)
Creat: 0.58 mg/dL (ref 0.50–1.10)
GLUCOSE: 70 mg/dL (ref 65–99)
POTASSIUM: 4 mmol/L (ref 3.5–5.3)
Sodium: 139 mmol/L (ref 135–146)

## 2016-07-19 LAB — HEPATIC FUNCTION PANEL
ALT: 16 U/L (ref 6–29)
AST: 17 U/L (ref 10–35)
Albumin: 3.9 g/dL (ref 3.6–5.1)
Alkaline Phosphatase: 50 U/L (ref 33–115)
BILIRUBIN DIRECT: 0.3 mg/dL — AB (ref <=0.2)
BILIRUBIN INDIRECT: 0.9 mg/dL (ref 0.2–1.2)
BILIRUBIN TOTAL: 1.2 mg/dL (ref 0.2–1.2)
Total Protein: 6.3 g/dL (ref 6.1–8.1)

## 2016-07-19 MED ORDER — OMEPRAZOLE 20 MG PO CPDR: 20.0000 mg | DELAYED_RELEASE_CAPSULE | Freq: Every day | ORAL | 1 refills | Status: AC

## 2016-07-19 NOTE — Progress Notes (Signed)
HPI:   [name] is a 50 y.o. female, who is here today to establish care with me.  Former PCP: [place]  Last preventive routine visit: 18 months ago.    Concerns today: ER visit, concerned about Dx, GI.  She presented to the ER on 07/16/2016 because 2 days of LLQ abdominal pain, 7/10, no radiated, associated with nausea. She was diagnosed with acute diverticulitis, currently she is on ciprofloxacin and metronidazole. Abdominal pain has improved greatly, now it is intermittently, from 0-1/10 in intensity. She denies any chills, fever, or myalgias. She is following liquid diet, she would like to know if she can advance her diet. She denies any decrease in appetite. She is complaining of difficulty sleeping, fatigue, worsening nausea, and burping. She has history of GERD, she is having heartburn. She usually takes Tums for relief of symptoms.   Lab Results  Component Value Date   WBC 13.3 (H) 07/16/2016   HGB 14.6 07/16/2016   HCT 41.9 07/16/2016   MCV 84.0 07/16/2016   PLT 311 07/16/2016   Abdominal/pelvic CT 07/17/16: Proximal acute diverticulitis.   She is concerned about this episode of diverticulitis, afraid that this could be related with colon cancer, she wonders if she needs to have a colonoscopy now. She is not aware of family history of colon cancer. She already scheduled an appointment with gastroenterologist, next available November 2017.  She tells me that she used to have some GI issues related to gluten: bloating sensation, oral ulcers, and arthralgias. She states that she has not been tested for celiac disease but she started gluten free diet about 12 years ago and all these symptoms resolved.  -Since yesterday she has had loose stools, exacerbated by food intake, 3-4 episodes, she denies any mucus or blood in the stool. She denies any vomiting, dysuria, gross hematuria, or decreased urine output.  She is having some perianal burning when  wiping after defecation, she denies any dyschezia/hematochezia or mass-like sensation. She is applying OTC topical medication and this seems to help.  -Recent labs in the ER show hypokalemia, 3.2, she states that she has history of "low potassium" Also noted elevated total bilirubin, 2.6, she is not aware of any history of abnormal LFTs.     Chemistry      Component Value Date/Time   NA 138 07/16/2016 2108   K 3.2 (L) 07/16/2016 2108   CL 106 07/16/2016 2108   CO2 24 07/16/2016 2108   BUN 9 07/16/2016 2108   CREATININE 0.61 07/16/2016 2108      Component Value Date/Time   CALCIUM 8.9 07/16/2016 2108   ALKPHOS 54 07/16/2016 2108   AST 18 07/16/2016 2108   ALT 19 07/16/2016 2108   BILITOT 2.6 (H) 07/16/2016 2108      -Her job [place] frequent travel[place]. She is concerned about flying after being diagnosed with diverticular disease, according to patient, she has heard that people who have diverticulosis cannot fly because it increases the risk of complications. She usually travels 2-3 times per month.  -She has history of OSA, she wears a CPAP, she has not established with sleep specialist.   Today during examination I noted a small heart murmur, she denies any prior history. Denies chest pain or exertional dyspnea.  She mentions that she has been in the ER a few times complaining of chest pain, workup has been negative, she has not had any for a couple months.    Review of  Systems  Constitutional: Positive for fatigue. Negative for activity change, appetite change, chills, fever and unexpected weight change.  HENT: Negative for facial swelling, mouth sores, nosebleeds, sore throat and trouble swallowing.   Respiratory: Negative for cough, shortness of breath and wheezing.   Cardiovascular: Negative for chest pain, palpitations and leg swelling.  Gastrointestinal: Positive for abdominal pain, diarrhea and nausea. Negative for abdominal distention, blood in stool  and vomiting.  Endocrine: Negative for polydipsia, polyphagia and polyuria.  Genitourinary: Negative for difficulty urinating, dysuria, frequency and hematuria.       LMP 07/18/16  Musculoskeletal: Negative for arthralgias, back pain and neck pain.  Allergic/Immunologic: Positive for food allergies (gluten).  Neurological: Negative for syncope, weakness, numbness and headaches.  Hematological: Negative for adenopathy. Does not bruise/bleed easily.  Psychiatric/Behavioral: Positive for sleep disturbance. Negative for confusion. The patient is nervous/anxious.       Current Outpatient Prescriptions on File Prior to Visit  Medication Sig Dispense Refill  . acetaminophen (TYLENOL) 325 MG tablet Take 2 tablets (650 mg total) by mouth every 6 (six) hours as needed. 60 tablet 0  . ciprofloxacin (CIPRO) 500 MG tablet Take 1 tablet (500 mg total) by mouth 2 (two) times daily. 14 tablet 0  . metroNIDAZOLE (FLAGYL) 500 MG tablet Take 1 tablet (500 mg total) by mouth 3 (three) times daily. 21 tablet 0  . ondansetron (ZOFRAN ODT) 4 MG disintegrating tablet Take 1 tablet (4 mg total) by mouth every 8 (eight) hours as needed for nausea or vomiting. 10 tablet 0   No current facility-administered medications on file prior to visit.      Past Medical History:  Diagnosis Date  . Depression   . GERD (gastroesophageal reflux disease)   . Hypertension   . Sleep apnea    Allergies  Allergen Reactions  . Gluten Meal     Family History  Problem Relation Age of Onset  . Alcohol abuse Mother   . Drug abuse Mother   . Alcohol abuse Father   . Drug abuse Father     Social History   Social History  . Marital status: Single    Spouse name: N/A  . Number of children: N/A  . Years of education: N/A   Social History Main Topics  . Smoking status: Never Smoker  . Smokeless tobacco: Never Used  . Alcohol use 0.0 oz/week     Comment: occasionally  . Drug use: No  . Sexual activity: Not Asked    Other Topics Concern  . None   Social History Narrative   ** Merged History Encounter **        Vitals:   07/19/16 1415  BP: 130/80  Pulse: 69  Resp: 12    Body mass index is 49.84 kg/m.     Physical Exam  Nursing note and vitals reviewed. Constitutional: She is oriented to person, place, and time. She appears well-developed. She does not appear ill. No distress.  HENT:  Head: Atraumatic.  Mouth/Throat: Oropharynx is clear and moist. Mucous membranes are dry.  Eyes: Conjunctivae and EOM are normal. Pupils are equal, round, and reactive to light.  Neck: No thyroid mass and no thyromegaly present.  Cardiovascular: Normal rate and regular rhythm.   Murmur (SEM I/VI LUSB) heard. Pulses:      Dorsalis pedis pulses are 2+ on the right side, and 2+ on the left side.  Respiratory: Effort normal and breath sounds normal. No respiratory distress.  GI: Soft. She exhibits no mass.  Bowel sounds are increased. There is no hepatomegaly. There is no tenderness.  + Frequent burping during OV.  Musculoskeletal: She exhibits edema (Trace pitting edema LE bilateral.). She exhibits no tenderness.  Lymphadenopathy:    She has no cervical adenopathy.  Neurological: She is alert and oriented to person, place, and time. She has normal strength. Coordination and gait normal.  Skin: Skin is warm. No erythema.  Psychiatric: Her speech is normal. Her mood appears anxious.  Well groomed, good eye contact.      ASSESSMENT AND PLAN:     Germani was seen today for new patient (initial visit).  Diagnoses and all orders for this visit:  Acute diverticulitis  Improving. Plan discussed some side effects of antibiotics she is currently taking. She can advance diet as tolerated. I explained that as far as I know there is no contraindications about flying/travelling unless acute symptoms are present.  Complete treatment, further recommendations would be given according to lab results. She  will keep appt with GI, she will need colonoscopy in 10/2016. Clearly instructed about warning signs.    -     Basic metabolic panel; Future -     CBC with Differential/Platelet -     Hepatic function panel -     Basic metabolic panel   Gastroesophageal reflux disease without esophagitis  Nausea be related to GERD. Recommended trying low-dose of PPI, Omeprazole 20 mg daily. GERD precautions discussed. Follow-up in 3 weeks.  -     omeprazole (PRILOSEC) 20 MG capsule; Take 1 capsule (20 mg total) by mouth daily.   Diarrhea, unspecified type  Acute. This could be related to antibiotic treatment, for now we will hold on further workup but if this continues we may need to evaluate for C diff. I recommended daily probiotic. Adequate hydration, Gatorade or Pedialyte at good option, small frequent sips. Instructed about warning signs. Follow-up in 3 weeks. -     Basic metabolic panel; Future  Heart murmur previously undiagnosed  Mild and asymptomatic. She is now concerned about possibility of serious heart disease. We discussed possible etiologies, explained most of the time is benign.  We will discuss possibility of echocardiogram next office visit or cardiology evaluation. Instructed about warning signs.   OSA on CPAP -     Ambulatory referral to Pulmonology  Hyperbilirubinemia  Mild elevation, I do not have prior labs for comparison, she is not aware of prior abnormalities. Further recommendations will be given according to lab results.   -     Hepatic function panel  Hypokalemia  Increased potassium intake through diet , further recommendations will be given according to lab results.  -     Basic metabolic panel; Future      Face to face OV from 2:37 to 3:15 pm, > 50% of the visit was dedicated to answer a list of questions she brought with her, discussion of physiopathology of diverticulosis, colon cancer screening, side effects of medications, and possible  causes of heart murmur.        Marlean Mortell G. [place], MD  [facility]. [place] office.

## 2016-07-19 NOTE — Patient Instructions (Addendum)
A few things to remember from today's visit:   Acute diverticulitis - Plan: CBC with Differential/Platelet, Hepatic Function Panel, Basic Metabolic Panel  Gastroesophageal reflux disease without esophagitis - Plan: omeprazole (PRILOSEC) 20 MG capsule  Diarrhea, unspecified type    GET HELP RIGHT AWAY IF:   The pain does not go away within 2 hours.  Sudden severe/worsening pain.  You keep throwing up (vomiting).  The pain changes and is only in the right or left part of the belly.  Not being able to pass gas or poop.  You have bloody or tarry looking poop.   MAKE SURE YOU:   Understand these instructions.  Will watch your condition.  Will get help right away if you are not doing well or get worse.      Please be sure medication list is accurate. If a new problem present, please set up appointment sooner than planned today.

## 2016-08-02 ENCOUNTER — Ambulatory Visit (INDEPENDENT_AMBULATORY_CARE_PROVIDER_SITE_OTHER): Payer: 59 | Admitting: Family Medicine

## 2016-08-02 ENCOUNTER — Encounter: Payer: Self-pay | Admitting: Family Medicine

## 2016-08-02 VITALS — BP 120/90 | HR 81 | Resp 12 | Ht 63.0 in | Wt 274.4 lb

## 2016-08-02 DIAGNOSIS — R030 Elevated blood-pressure reading, without diagnosis of hypertension: Secondary | ICD-10-CM

## 2016-08-02 DIAGNOSIS — Z6841 Body Mass Index (BMI) 40.0 and over, adult: Secondary | ICD-10-CM

## 2016-08-02 DIAGNOSIS — R011 Cardiac murmur, unspecified: Secondary | ICD-10-CM

## 2016-08-02 DIAGNOSIS — K219 Gastro-esophageal reflux disease without esophagitis: Secondary | ICD-10-CM

## 2016-08-02 DIAGNOSIS — K5730 Diverticulosis of large intestine without perforation or abscess without bleeding: Secondary | ICD-10-CM

## 2016-08-02 NOTE — Progress Notes (Signed)
HPI:   [name] is a 50 y.o. female, who is here today to follow on her last OV, 07/19/16,when she was following on a ER visit and on treatment for acute diverticulitis.    Complete abx treatment about a week ago. Still bloating sensation and lower abdominal discomfort (RLQ and LLQ) , "very mild", ocassional nausea "slight", and diarrhea greatly better: having  1-2 "mudy stools", small.  Episodes of fecal incontinence, states that every time she wipes perianal area, even after voiding she notes stool on wipe. She denies stool on underwear.  She has GI appt in 09/2016  Last OV, I also recommended Omeprazole because she was c/o heartburn. Symptoms have improved some with GERD precautions but she has not started PPI, she states that she did not want to be on more meds while she was on oral abx.  She denies any fever, chills, vomiting, blood in stool, or melena.  - Last OV , I heart a small heart murmur during examination, no prior Hx. She is concerned about serious heart disease. Denies chest pain, dyspnea, palpitation, claudication, focal weakness, or edema.      Review of Systems  Constitutional: Positive for fatigue. Negative for chills, diaphoresis, fever and unexpected weight change.  HENT: Negative for facial swelling, mouth sores, nosebleeds, sore throat and trouble swallowing.   Respiratory: Negative for cough, shortness of breath and wheezing.   Cardiovascular: Negative for chest pain, palpitations and leg swelling.  Gastrointestinal: Positive for abdominal pain and nausea. Negative for abdominal distention, blood in stool and vomiting.  Genitourinary: Negative for difficulty urinating, dysuria, frequency and hematuria.       LMP 07/18/16  Musculoskeletal: Negative for back pain and neck pain.  Allergic/Immunologic: Positive for food allergies (gluten).  Neurological: Negative for syncope, weakness, numbness and headaches.  Psychiatric/Behavioral: Negative  for confusion. The patient is nervous/anxious.       Current Outpatient Prescriptions on File Prior to Visit  Medication Sig Dispense Refill  . acetaminophen (TYLENOL) 325 MG tablet Take 2 tablets (650 mg total) by mouth every 6 (six) hours as needed. 60 tablet 0  . omeprazole (PRILOSEC) 20 MG capsule Take 1 capsule (20 mg total) by mouth daily. 30 capsule 1  . ondansetron (ZOFRAN ODT) 4 MG disintegrating tablet Take 1 tablet (4 mg total) by mouth every 8 (eight) hours as needed for nausea or vomiting. 10 tablet 0   No current facility-administered medications on file prior to visit.      Past Medical History:  Diagnosis Date  . Depression   . GERD (gastroesophageal reflux disease)   . Hypertension   . Sleep apnea    Allergies  Allergen Reactions  . Gluten Meal     Social History   Social History  . Marital status: Single    Spouse name: N/A  . Number of children: N/A  . Years of education: N/A   Social History Main Topics  . Smoking status: Never Smoker  . Smokeless tobacco: Never Used  . Alcohol use 0.0 oz/week     Comment: occasionally  . Drug use: No  . Sexual activity: Not Asked   Other Topics Concern  . None   Social History Narrative   ** Merged History Encounter **        Vitals:   08/02/16 0844  BP: 120/90  Pulse: 81  Resp: 12    O2 sat at RA 94%.  Body mass index is 48.6 kg/m.  Wt  Readings from Last 3 Encounters:  08/02/16 274 lb 6 oz (124.5 kg)  07/19/16 281 lb 6 oz (127.6 kg)  07/16/16 283 lb (128.4 kg)      Physical Exam  Nursing note and vitals reviewed. Constitutional: She is oriented to person, place, and time. She appears well-developed. She does not appear ill. No distress.  HENT:  Head: Atraumatic.  Mouth/Throat: Oropharynx is clear and moist and mucous membranes are normal.  Eyes: Conjunctivae and EOM are normal.  Neck: No JVD present.  Cardiovascular: Normal rate and regular rhythm.   Murmur (SEM I/VI LUSB)  heard. Pulses:      Dorsalis pedis pulses are 2+ on the right side, and 2+ on the left side.  Respiratory: Effort normal and breath sounds normal. No respiratory distress.  GI: Soft. Bowel sounds are normal. She exhibits no mass. There is no hepatomegaly. There is no tenderness.  Musculoskeletal: She exhibits edema (1+  pitting edema LE bilateral.). She exhibits no tenderness.  Neurological: She is alert and oriented to person, place, and time. She has normal strength. Coordination and gait normal.  Skin: Skin is warm. No erythema.  Psychiatric: Her speech is normal. Her mood appears anxious.  Well groomed, good eye contact.      ASSESSMENT AND PLAN:     Laura Rowe was seen today for follow-up.  Diagnoses and all orders for this visit:  Gastroesophageal reflux disease without esophagitis  Improved with GERD precautions. Recommend starting Omeprazole 20 mg. Wt loss also may help.  Heart murmur previously undiagnosed  Mild. Asymptomatic. Echo will be arranged.  -     ECHOCARDIOGRAM COMPLETE; Future  Diverticulosis of large intestine without hemorrhage  Acute diverticulosis has resolved. We discussed symptoms and treatment options for diverticulosis. Adequate fiber and fluid intake to prevent constipation. Bloating sensation and other GI symptoms reported could also be related to abx use. Daily Probiotic. Instructed about warning signs.  BMI 45.0-49.9, adult [facility])  She has lost about 7 pounds since her last OV. We discussed benefits of wt loss as well as adverse effects of obesity. Consistency with healthy diet and physical activity recommended. If interested Weight Watchers is a good option as well as daily brisk walking for 15-30 min as tolerated.  Elevated blood-pressure reading without diagnosis of hypertension  Improved when re-checked. Recommend checking BP at home periodically. Regular exercise and wt loss may certainly help.   -I will see her in 3 months for  her routine physical and fasting lab work.   OV face to face from 8:51 Am-9:18 Am, > 50% of time was dedicated to discussion of medications side effects, possible causes of heart murmurs, and discussion of diverticulosis. She has an appt with GI, which she is planning on keeping and most likely colonoscopy is going to be arranged.    -Ms. Koren BoundLyna M Rowe was advised to return sooner than planned today if new concerns arise.       Andriana Casa G. [place], MD  [facility]. [place] office.

## 2016-08-02 NOTE — Progress Notes (Signed)
Pre visit review using our clinic review tool, if applicable. No additional management support is needed unless otherwise documented below in the visit note. 

## 2016-08-02 NOTE — Patient Instructions (Addendum)
A few things to remember from today's visit:   Gastroesophageal reflux disease without esophagitis  Heart murmur previously undiagnosed - Plan: ECHOCARDIOGRAM COMPLETE  Diverticulosis of large intestine without hemorrhage  Start Omeprazole. Keep appointment with GI and appointment for echo will be arranged.   Diverticulosis Diverticulosis is the condition that develops when small pouches (diverticula) form in the wall of your colon. Your colon, or large intestine, is where water is absorbed and stool is formed. The pouches form when the inside layer of your colon pushes through weak spots in the outer layers of your colon. CAUSES  No one knows exactly what causes diverticulosis. RISK FACTORS  Being older than 50. Your risk for this condition increases with age. Diverticulosis is rare in people younger than 40 years. By age 50, almost everyone has it.  Eating a low-fiber diet.  Being frequently constipated.  Being overweight.  Not getting enough exercise.  Smoking.  Taking over-the-counter pain medicines, like aspirin and ibuprofen. SYMPTOMS  Most people with diverticulosis do not have symptoms. DIAGNOSIS  Because diverticulosis often has no symptoms, health care providers often discover the condition during an exam for other colon problems. In many cases, a health care provider will diagnose diverticulosis while using a flexible scope to examine the colon (colonoscopy). TREATMENT  If you have never developed an infection related to diverticulosis, you may not need treatment. If you have had an infection before, treatment may include:  Eating more fruits, vegetables, and grains.  Taking a fiber supplement.  Taking a live bacteria supplement (probiotic).  Taking medicine to relax your colon. HOME CARE INSTRUCTIONS   Drink at least 6-8 glasses of water each day to prevent constipation.  Try not to strain when you have a bowel movement.  Keep all follow-up  appointments. If you have had an infection before:  Increase the fiber in your diet as directed by your health care provider or dietitian.  Take a dietary fiber supplement if your health care provider approves.  Only take medicines as directed by your health care provider. SEEK MEDICAL CARE IF:   You have abdominal pain.  You have bloating.  You have cramps.  You have not gone to the bathroom in 3 days. SEEK IMMEDIATE MEDICAL CARE IF:   Your pain gets worse.  Yourbloating becomes very bad.  You have a fever or chills, and your symptoms suddenly get worse.  You begin vomiting.  You have bowel movements that are bloody or black. MAKE SURE YOU:  Understand these instructions.  Will watch your condition.  Will get help right away if you are not doing well or get worse.   This information is not intended to replace advice given to you by your health care provider. Make sure you discuss any questions you have with your health care provider.   Document Released: 07/18/2004 Document Revised: 10/26/2013 Document Reviewed: 09/15/2013 Elsevier Interactive Patient Education 2016 [employer]  Monitor blood pressure at home.   Please be sure medication list is accurate. If a new problem present, please set up appointment sooner than planned today.

## 2016-09-23 ENCOUNTER — Ambulatory Visit: Payer: Self-pay | Admitting: Gastroenterology

## 2017-07-24 ENCOUNTER — Encounter: Payer: Self-pay | Admitting: Family Medicine

## 2018-01-13 IMAGING — CT CT ABD-PELV W/ CM
2 of 5 series · 16 of 46 positions shown, 18 images · IV contrast (ISOVUE)
Comparison: None.

CLINICAL DATA: Abdominal pain 430 hours.  Predominantly left-sided.

EXAM:
CT ABDOMEN AND PELVIS WITH CONTRAST
TECHNIQUE: Multidetector CT imaging of the abdomen and pelvis was performed
using the standard protocol following bolus administration of
intravenous contrast.
CONTRAST:  100mL V2CEJQ-2LL IOPAMIDOL (V2CEJQ-2LL) INJECTION 61%

[Series 2: abd/pel with · axial · 0.84mm/px · z∈[-624,-204]mm · 13 of 100 slices shown, 15 images]
[im 8/100  soft-tissue]
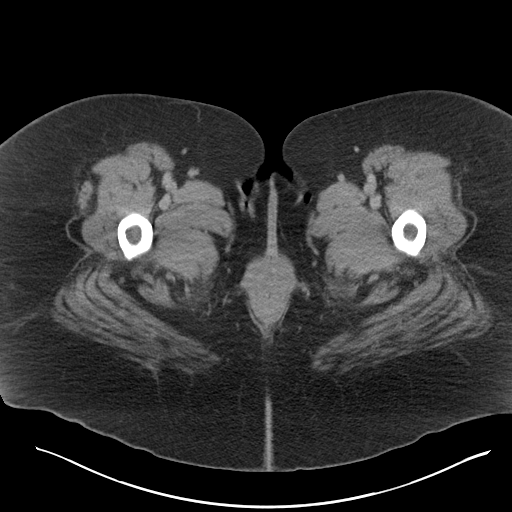
[im 8/100  bone]
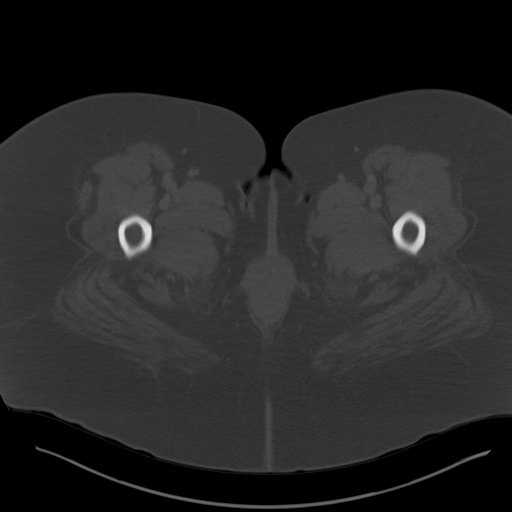
[im 15/100  soft-tissue]
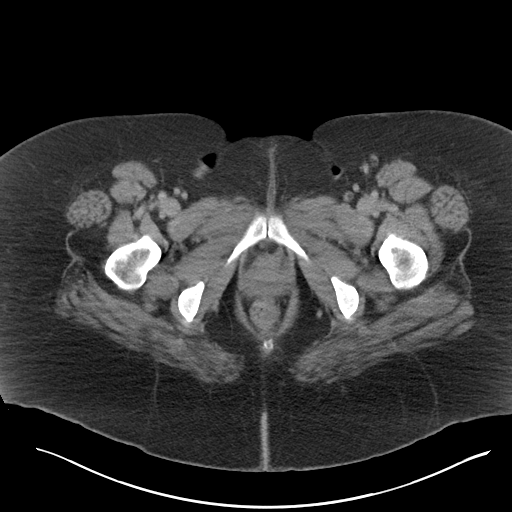
[im 22/100  soft-tissue]
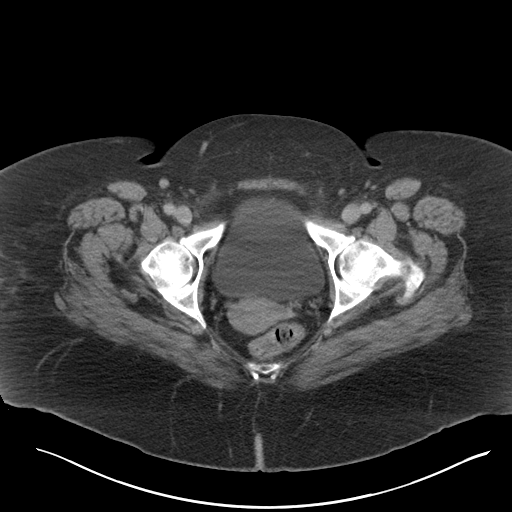
[im 29/100  soft-tissue]
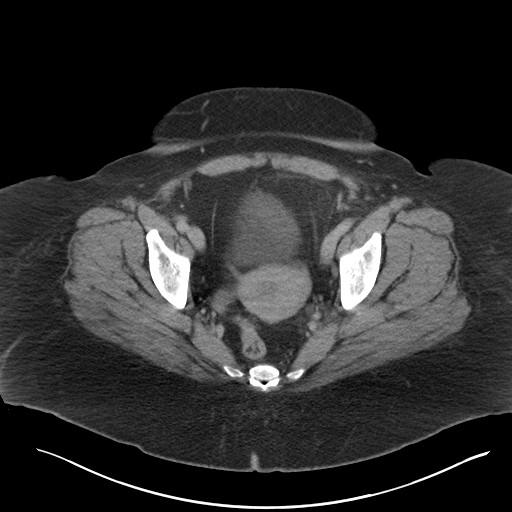
[im 36/100  soft-tissue]
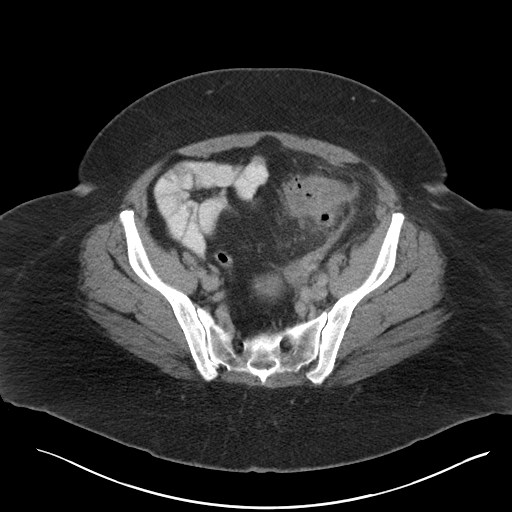
[im 43/100  soft-tissue]
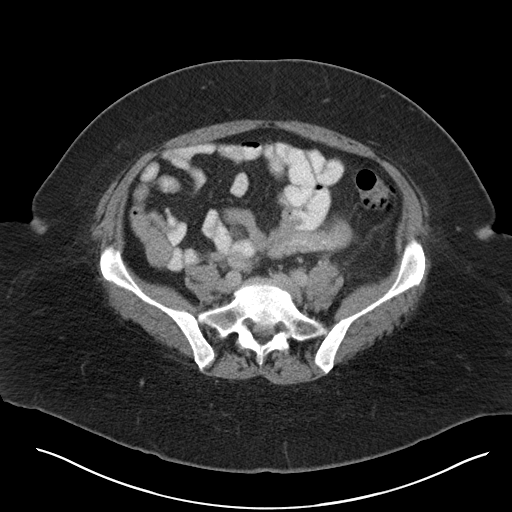
[im 50/100  soft-tissue]
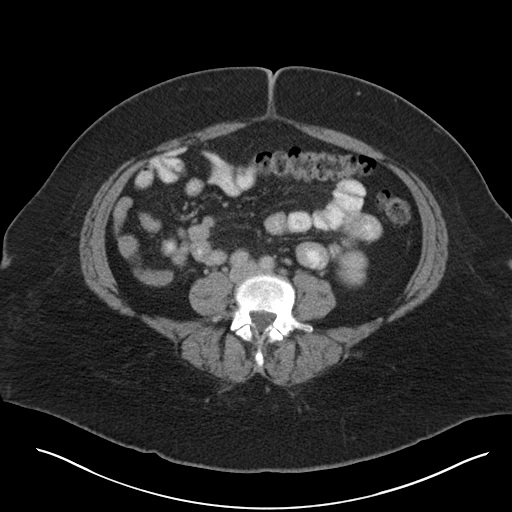
[im 57/100  soft-tissue]
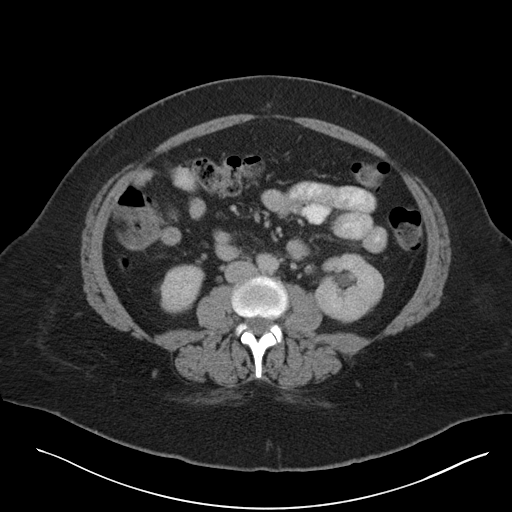
[im 64/100  soft-tissue]
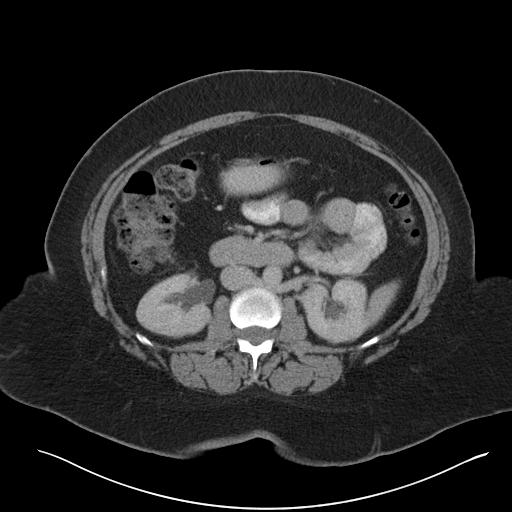
[im 64/100  bone]
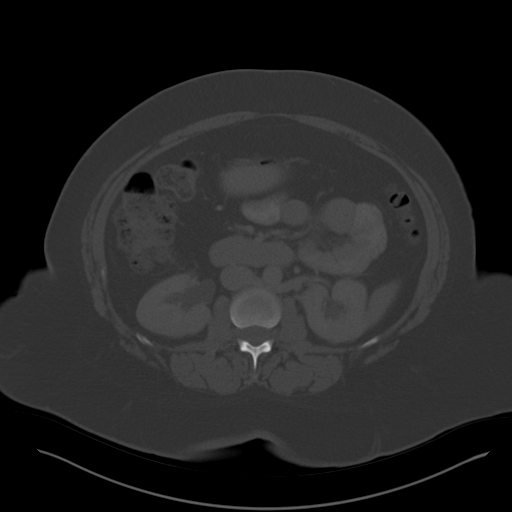
[im 71/100  soft-tissue]
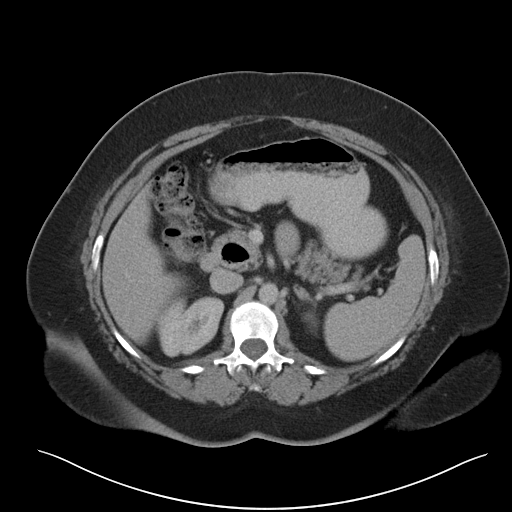
[im 78/100  soft-tissue]
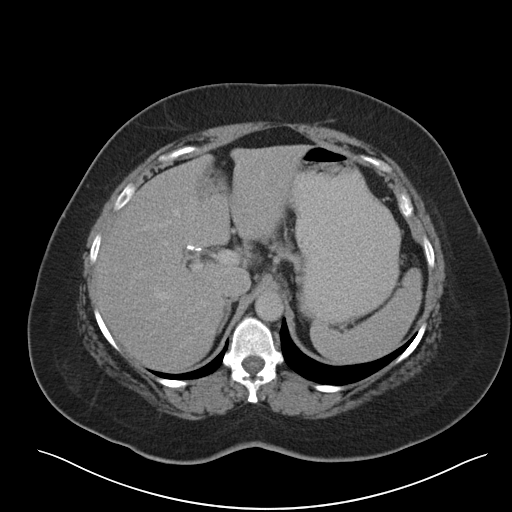
[im 85/100  soft-tissue]
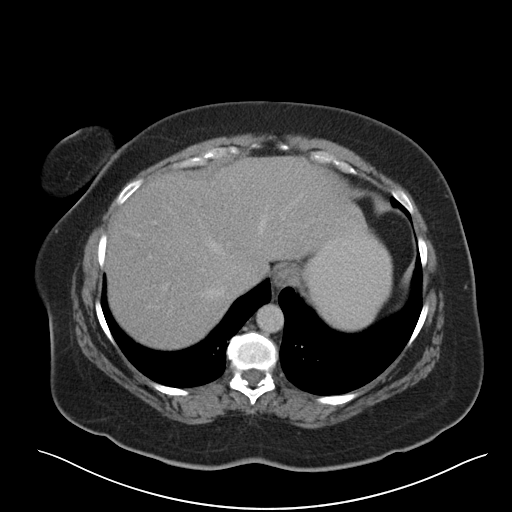
[im 92/100  soft-tissue]
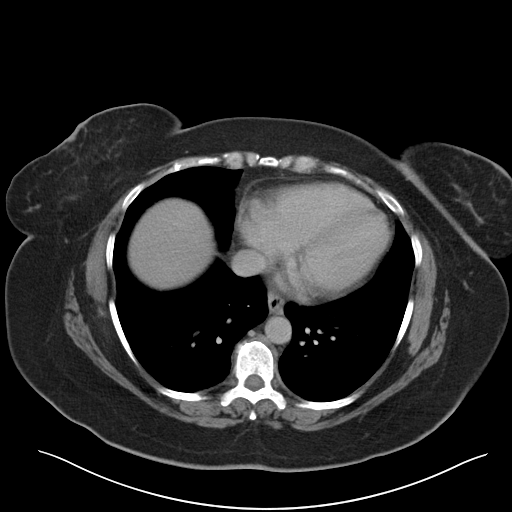

[Series 5: coronal a/|p · coronal · 0.78mm/px · 3 of 169 slices shown]
[im 57/169  soft-tissue]
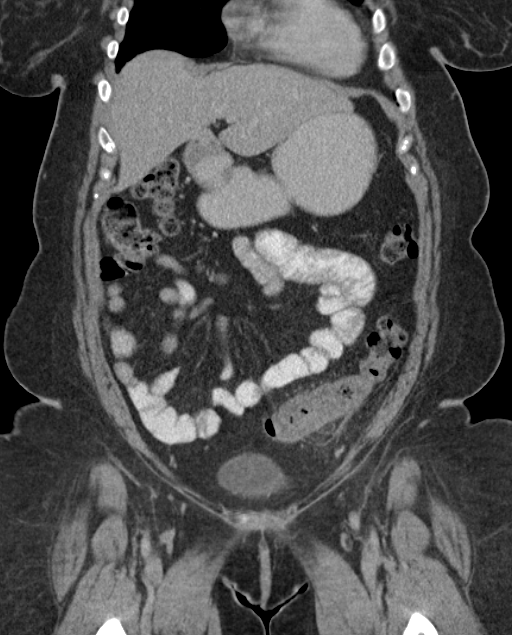
[im 75/169  soft-tissue]
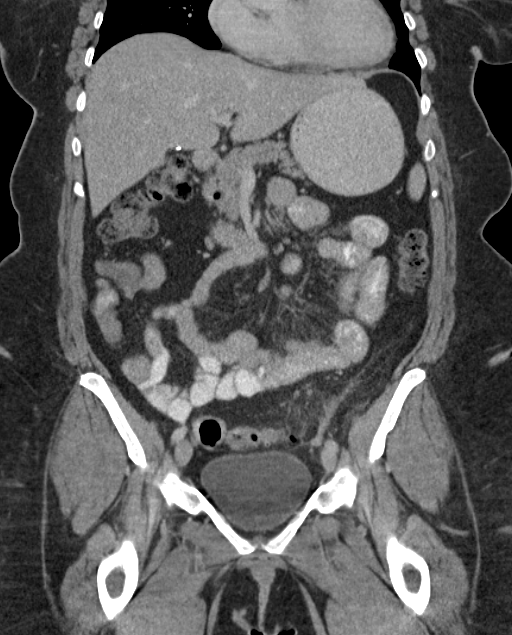
[im 94/169  soft-tissue]
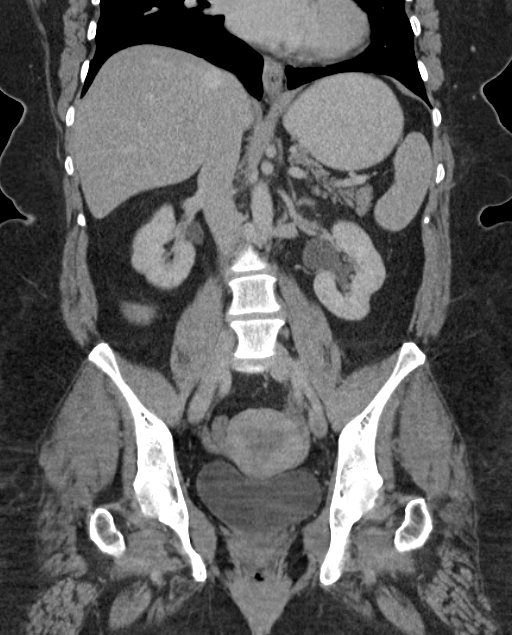

[16 of 46 positions shown; findings below may reference images not displayed]

FINDINGS: Lower chest: No significant abnormality

Hepatobiliary: Cholecystectomy. No focal liver lesion. Normal bile
ducts.

Pancreas: Normal

Spleen: Normal

Adrenals/Urinary Tract: The adrenals and kidneys are normal in
appearance. There is no urinary calculus evident. There is no
hydronephrosis or ureteral dilatation. Collecting systems and
ureters appear unremarkable. Urinary bladder is unremarkable.

Stomach/Bowel: There are acute inflammatory changes surrounding a
diverticulum of the proximal sigmoid colon with appearances typical
of acute diverticulitis. No abscess. Moderate left hemicolon
diverticulosis. Stomach, small bowel and right hemicolon are
unremarkable.

Vascular/Lymphatic: The abdominal aorta is normal in caliber. There
is no atherosclerotic calcification. There is no adenopathy in the
abdomen or pelvis.

Reproductive: Uterus and ovaries are unremarkable

Other: No ascites

Musculoskeletal: No significant skeletal lesion
IMPRESSION: Acute diverticulitis, proximal sigmoid.  No abscess.
